# Patient Record
Sex: Female | Born: 1990 | Race: White | Hispanic: No | Marital: Single | Smoking: Never smoker
Health system: Southern US, Community
[De-identification: ages and names within clinical notes are randomized; demographics above are authoritative.]

## PROBLEM LIST (undated history)

## (undated) HISTORY — PX: OTHER SURGICAL HISTORY: SHX169

## (undated) HISTORY — PX: APPENDECTOMY: SHX54

---

## 2017-01-05 ENCOUNTER — Encounter: Payer: Self-pay | Admitting: Sports Medicine

## 2017-01-05 ENCOUNTER — Ambulatory Visit (INDEPENDENT_AMBULATORY_CARE_PROVIDER_SITE_OTHER): Payer: BLUE CROSS/BLUE SHIELD | Admitting: Sports Medicine

## 2017-01-05 ENCOUNTER — Ambulatory Visit (INDEPENDENT_AMBULATORY_CARE_PROVIDER_SITE_OTHER): Payer: BLUE CROSS/BLUE SHIELD

## 2017-01-05 VITALS — BP 108/72 | HR 60 | Ht 67.0 in | Wt 136.4 lb

## 2017-01-05 DIAGNOSIS — G8929 Other chronic pain: Secondary | ICD-10-CM | POA: Diagnosis not present

## 2017-01-05 DIAGNOSIS — M2242 Chondromalacia patellae, left knee: Secondary | ICD-10-CM

## 2017-01-05 DIAGNOSIS — M25562 Pain in left knee: Secondary | ICD-10-CM

## 2017-01-05 DIAGNOSIS — M2241 Chondromalacia patellae, right knee: Secondary | ICD-10-CM | POA: Diagnosis not present

## 2017-01-05 DIAGNOSIS — M25561 Pain in right knee: Secondary | ICD-10-CM

## 2017-01-05 DIAGNOSIS — M228X9 Other disorders of patella, unspecified knee: Secondary | ICD-10-CM

## 2017-01-05 MED ORDER — DICLOFENAC SODIUM 2 % TD SOLN: 1.0000 "application " | Freq: Two times a day (BID) | TRANSDERMAL | 2 refills | Status: AC

## 2017-01-05 NOTE — Patient Instructions (Signed)
Also check out the YouTube Video from Dr. Eric Goodman.  I would like to see you try performing this 5-6 days per week.    A good intro video is: "Independence from Pain 7-minute Video" - https://www.youtube.com/watch?[email or web address]   His more advanced video is: "Powerful Posture and Pain Relief: 12 minutes of Foundation Training" - https://youtu.be/[email or web address]   Do not try to attempt this entire video when first beginning.    Try breaking of each exercise that he goes into shorter segments.  Otherwise if they perform an exercise for 45 seconds, start with 15 seconds and rest and then resume with a begin the new activity.  Work your way up to doing this 12 minute video and I expect to see significant improvements in your pain.  

## 2017-01-05 NOTE — Progress Notes (Signed)
OFFICE VISIT NOTE Tamara Sanford. [facility]ara [facility] at [facility] [phone]  Tamara Sanford - 26 y.o. female MRN [id number]  Date of birth: 15-Oct-1990  Visit Date: 01/05/2017  PCP: No primary care provider on file.   Referred by: No ref. provider found  Tamara Sanford, CMA acting as scribe for Tamara Sanford.  SUBJECTIVE:   Chief Complaint  Patient presents with  . New Patient (Initial Visit)    bilateral knee pain   HPI: As below and per problem based documentation when appropriate.  Tamara Sanford is a new patient presenting today with bilateral knee pain. Pain started about 9-10 years ago. The pain is all over the knee, medial, lateral, inferior to the patella and behind the patella. Her knees are constantly cracking.  No known injury or trauma. She was a former Database administrator  The pain is described as dull ache, stiffness, Pain is rated as 1/10 currently but 6/10 when at its worst.  Worsened with running and stating still for prolonged periods of time. Its also worse when standing for long periods of time.  Nothing seems to alleviate the pain.  Therapies tried include : heat, ice, NSAIDs, steroid injections, knee brace on the right leg. She did PT for awhile. She has tried kinesiology tape. She has arthroscopic surgery on her right knee to "clean it out" in 2010.   Other associated symptoms include: pain radiates into the lower legs, groin and hips at times. She denies low back pain or pelvic pain. She has tenderness to palpation on her lower back and legs.   No recent xray of either knee. She has been seen by rheumatologist at [facility] in 2015.     Review of Systems  Constitutional: Negative for chills, fever and malaise/fatigue.  Respiratory: Negative for shortness of breath and wheezing.   Cardiovascular: Negative for chest pain, palpitations and leg swelling.  Musculoskeletal: Positive for back pain, joint pain and  myalgias. Negative for falls.  Neurological: Positive for headaches. Negative for dizziness and tingling.  Endo/Heme/Allergies: Does not bruise/bleed easily.    Otherwise per HPI.  HISTORY & PERTINENT PRIOR DATA:  No specialty comments available. She reports that she has never smoked. She has never used smokeless tobacco. No results for input(s): HGBA1C, LABURIC in the last 8760 hours. Medications & Allergies reviewed per EMR Patient Active Problem List   Diagnosis Date Noted  . 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 No pertinent past medical history. Family History  Problem Relation Age of Onset  . Kidney Stones Father   . Alcohol abuse Maternal Grandmother   . Breast cancer Paternal Grandmother   . Heart disease Paternal Grandfather    Past Surgical History:  Procedure Laterality Date  . APPENDECTOMY    . arthroscopic knee surgery    . left wrist surgery     Social History   Occupational History  . Not on file.   Social History Main Topics  . Smoking status: Never Smoker  . Smokeless tobacco: Never Used  . Alcohol use Yes     Comment: couple drinks per week  . Drug use: No  . Sexual activity: Yes    Birth control/ protection: Pill    OBJECTIVE:  VS:  HT:5\' 7"  (170.2 cm)   WT:136 lb 6.4 oz (61.9 kg)  BMI:21.4    BP:108/72  HR:60bpm  TEMP: ( )  RESP:98 % EXAM: Findings:  WDWN, NAD, Non-toxic appearing Alert &  appropriately interactive Not depressed or anxious appearing No increased work of breathing. Pupils are equal. EOM intact without nystagmus No clubbing or cyanosis of the extremities appreciated No significant rashes/lesions/ulcerations overlying the examined area. DP & PT pulses 2+/4.  No significant pretibial edema. Sensation intact to light touch in lower extremities.    Bilateral lower extremities overall well aligned.  She does have a slight Femoral internal torsion bilaterally, right worse than left but this is minimal.   Only mild genu valgus.   She has no pain but a positive patellar grind bilaterally, right worse than left.  She is ligamentously stable to varus and valgus strain, anterior posterior drawer, Lachman's. McMurray's and Candie Chromanhessaly are normal.  She does have a tensor fascia lata predominance with hip abduction is slight weakness on the right greater than left hip when isolating the glute medius.  She has poor VMO definition bilaterally.  No focal back pain.     Dg Knee 1-2 Views Left  Result Date: 01/05/2017 CLINICAL DATA:  Worsening chronic bilateral knee pain over the past 10 years with greatest severity on the right. No recent injury. History of arthroscopic surgery on the right in 2010 EXAM: LEFT KNEE - 1-2 VIEW; RIGHT KNEE 3 VIEWS COMPARISON:  None in PACs FINDINGS: Right knee: The bones are subjectively adequately mineralized. The joint spaces are reasonably well-maintained. There is no chondrocalcinosis. There is a small suprapatellar effusion. There is no acute or old fracture. Left knee: The bones are subjectively adequately mineralized. There is a small suprapatellar effusion. The joint spaces are well maintained. There is no acute or old fracture. IMPRESSION: There is no acute bony abnormality of the right knee. Electronically Signed   By: David  SwazilandJordan M.D.   On: 01/05/2017 14:12   Dg Knee Ap/lat W/sunrise Right  Result Date: 01/05/2017 CLINICAL DATA:  Worsening chronic bilateral knee pain over the past 10 years with greatest severity on the right. No recent injury. History of arthroscopic surgery on the right in 2010 EXAM: LEFT KNEE - 1-2 VIEW; RIGHT KNEE 3 VIEWS COMPARISON:  None in PACs FINDINGS: Right knee: The bones are subjectively adequately mineralized. The joint spaces are reasonably well-maintained. There is no chondrocalcinosis. There is a small suprapatellar effusion. There is no acute or old fracture. Left knee: The bones are subjectively adequately mineralized. There is a small  suprapatellar effusion. The joint spaces are well maintained. There is no acute or old fracture. IMPRESSION: There is no acute bony abnormality of the right knee. Electronically Signed   By: David  [place] M.D.   On: 01/05/2017 14:12   ASSESSMENT & PLAN:     ICD-10-CM   1. Chronic pain of both knees M25.561 DG Knee 1-2 Views Left   M25.562 DG Knee AP/LAT W/Sunrise Right   G89.29 Diclofenac Sodium (PENNSAID) 2 % SOLN  2. Chondromalacia of both patellae M22.41    M22.42   3. Patellar maltracking, unspecified laterality M22.8X9 Diclofenac Sodium (PENNSAID) 2 % SOLN  ================================================================= Chondromalacia of both patellae Links to [employer] videos provided today per Patient Instructions.  These exercises were developed by Myles LippsEric Goodman, DC with a strong emphasis on core neuromuscular reducation and postural realignment through body-weight exercises.  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  We discussed the importance of VMO strengthening as well as hip abduction strengthening to decrease the valgus moment arm with ambulation.  Core  conditioning as well as continued home exercises were emphasized.    Can consider injection therapy if persistent crepitation however like to try to minimize this.  We did discuss that she may end up having issues with her knees later in life however the stronger she keeps him with good biomechanics the better off she will be.    We will plan to follow-up with her on an as-needed basis only.  I am happy to refill the Pennsaid we provided her if it proves to be helpful.     ================================================================= Patient Instructions  Also check out the YouTube Video from Dr. Myles Lipps.  I would like to see you try performing this 5-6 days per week.    A good intro  video is: "Independence from Pain 7-minute Video" - [email or web address]   His more advanced video is: "Powerful Posture and Pain Relief: 12 minutes of Foundation Training" - [email or web address]://[email or web address].[email or web address]   Do not try to attempt this entire video when first beginning.    Try breaking of each exercise that he goes into shorter segments.  Otherwise if they perform an exercise for 45 seconds, start with 15 seconds and rest and then resume with a begin the new activity.  Work your way up to doing this 12 minute video and I expect to see significant improvements in your pain.  ================================================================= No future appointments.  Follow-up: Return if symptoms worsen or fail to improve.   CMA/ATC served as Neurosurgeon during this visit. History, Physical, and Plan performed by medical provider. Documentation and orders reviewed and attested to.      Gaspar Bidding, DO    [facility]

## 2017-01-08 DIAGNOSIS — M2241 Chondromalacia patellae, right knee: Secondary | ICD-10-CM | POA: Insufficient documentation

## 2017-01-08 DIAGNOSIS — M2242 Chondromalacia patellae, left knee: Secondary | ICD-10-CM

## 2017-01-08 NOTE — Assessment & Plan Note (Signed)
Links to [employer] videos provided today per Patient Instructions.  These exercises were developed by Myles LippsEric Goodman, DC with a strong emphasis on core neuromuscular reducation and postural realignment through body-weight exercises.  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  We discussed the importance of VMO strengthening as well as hip abduction strengthening to decrease the valgus moment arm with ambulation.  Core conditioning as well as continued home exercises were emphasized.    Can consider injection therapy if persistent crepitation however like to try to minimize this.  We did discuss that she may end up having issues with her knees later in life however the stronger she keeps him with good biomechanics the better off she will be.    We will plan to follow-up with her on an as-needed basis only.  I am happy to refill the Pennsaid we provided her if it proves to be helpful.

## 2018-10-08 IMAGING — DX DG KNEE 1-2V*L*
2 series · 2 of 2 positions shown · non-contrast
Comparison: None in PACs

CLINICAL DATA: Worsening chronic bilateral knee pain over the past
10 years with greatest severity on the right. No recent injury.
History of arthroscopic surgery on the right in 0575

EXAM:
LEFT KNEE - 1-2 VIEW; RIGHT KNEE 3 VIEWS

[knee standing lat]
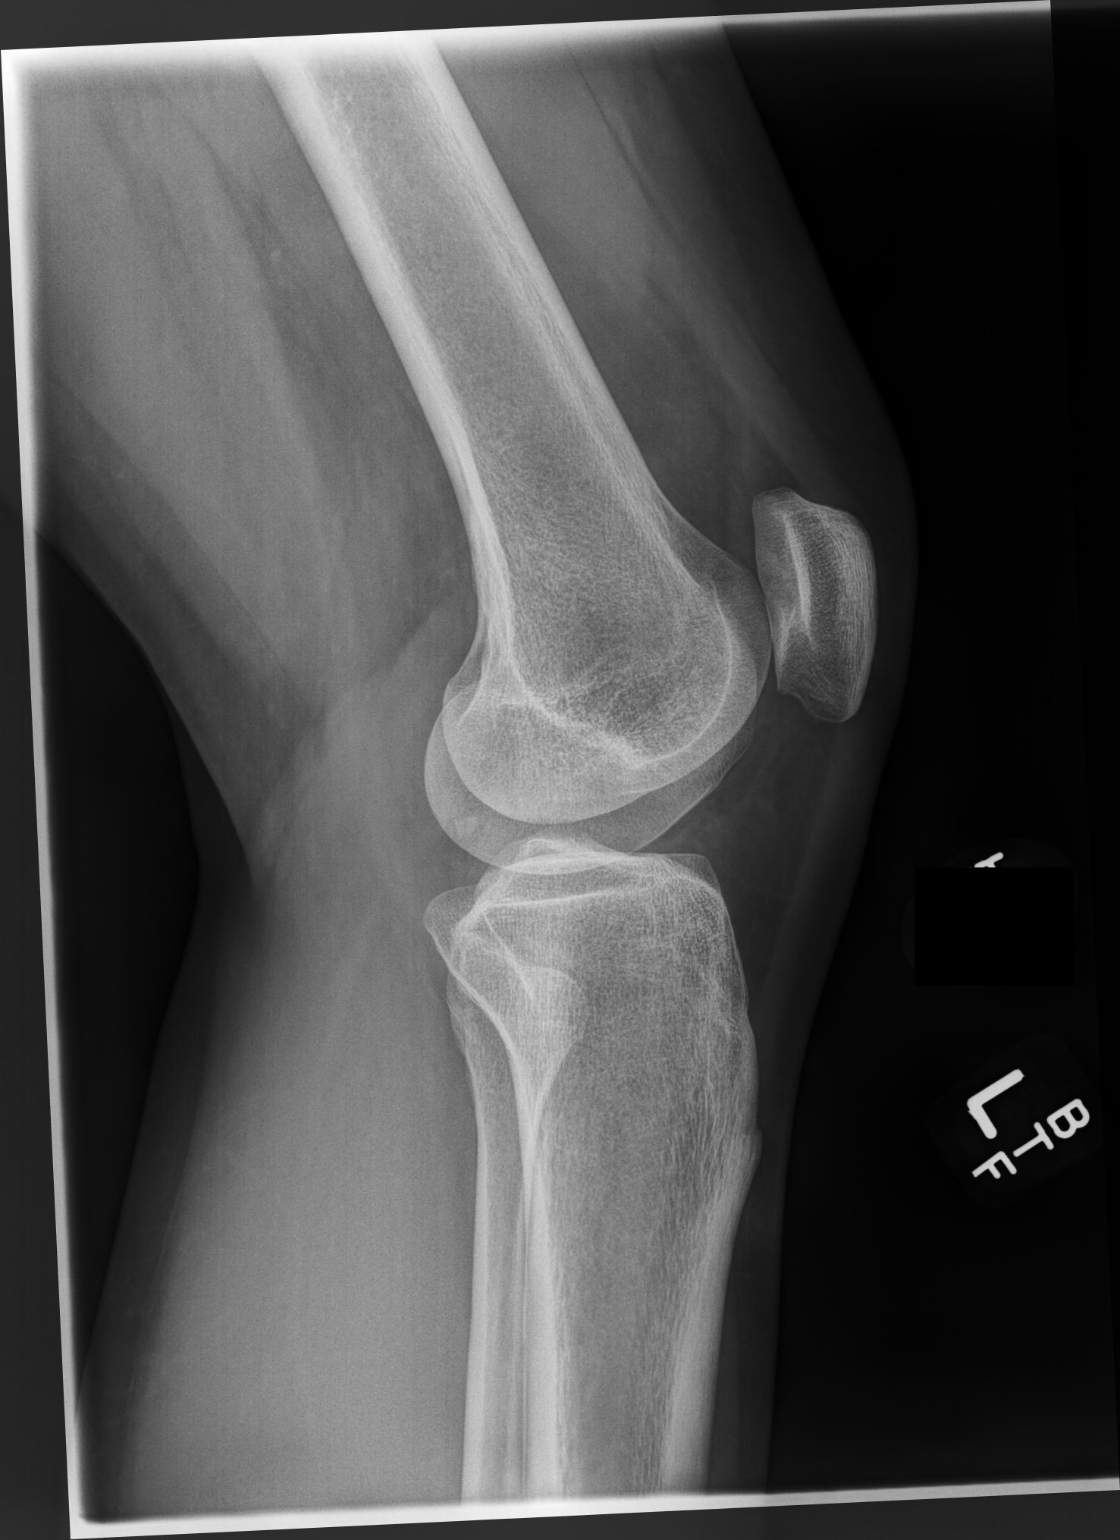

[sunrise]
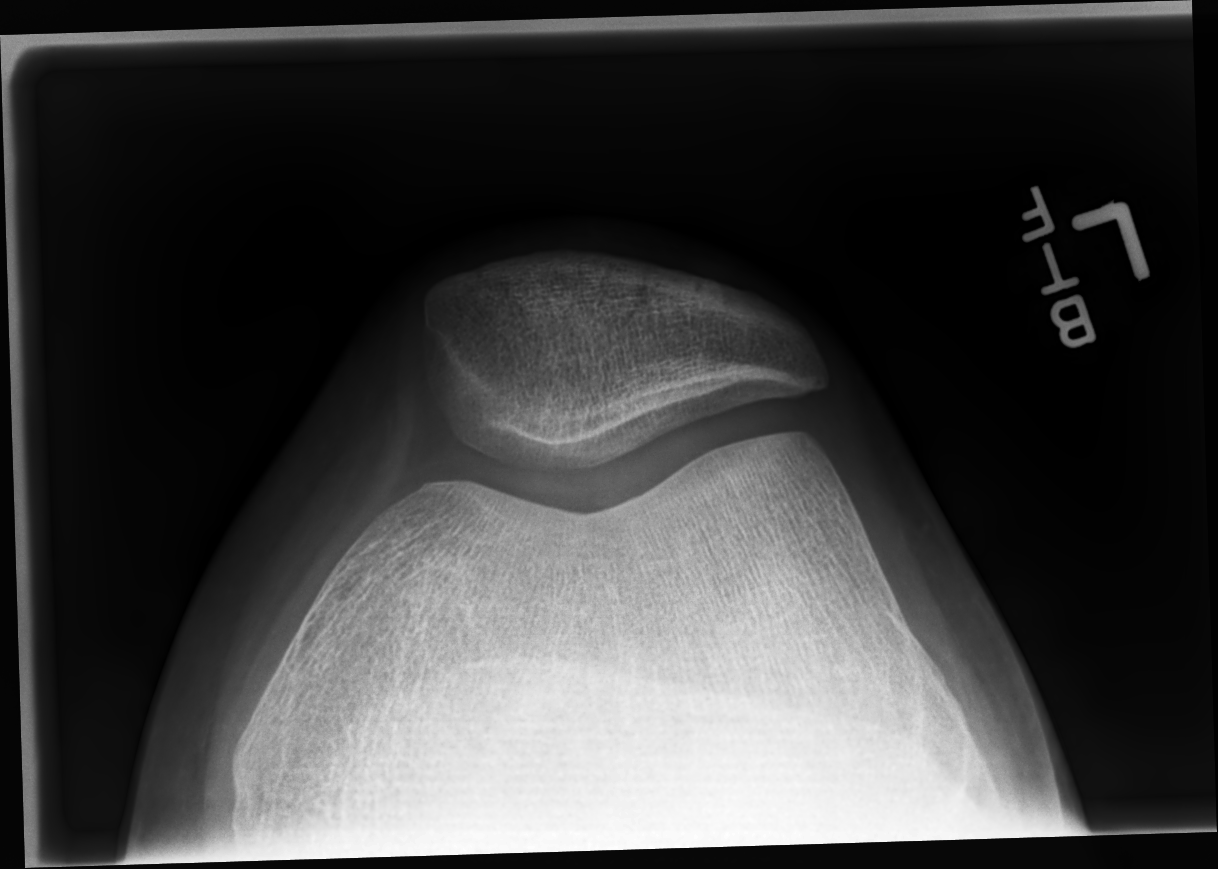

[2 of 2 positions shown; findings below may reference images not displayed]

FINDINGS: Right knee: The bones are subjectively adequately mineralized. The
joint spaces are reasonably well-maintained. There is no
chondrocalcinosis. There is a small suprapatellar effusion. There is
no acute or old fracture.

Left knee: The bones are subjectively adequately mineralized. There
is a small suprapatellar effusion. The joint spaces are well
maintained. There is no acute or old fracture.
IMPRESSION: There is no acute bony abnormality of the right knee.
